# Patient Record
Sex: Female | Born: 1980 | Race: White | Hispanic: No | Marital: Single | Smoking: Current every day smoker
Health system: Western US, Academic
[De-identification: ages and names within clinical notes are randomized; demographics above are authoritative.]

---
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
## 2009-06-28 HISTORY — PX: OTHER PROCEDURE: U1053

## 2011-05-22 IMAGING — RF DG VCUG
7 series · 7 of 7 positions shown · non-contrast
Comparison: Cystogram 05/05/2009

CLINICAL DATA: Status post diverticulectomy.

VOIDING CYSTOURETHROGRAM
TECHNIQUE: After catheterization of the urinary bladder following
sterile technique the bladder was filled with 200 ml Cysto-hypaque
30% by drip infusion.  Serial spot images were obtained during
bladder filling and voiding.
Fluoroscopy Time: 2.1 minutes

[Series 1: run · 1 of 1 slices shown (1 of 7)]
[im 1/1]
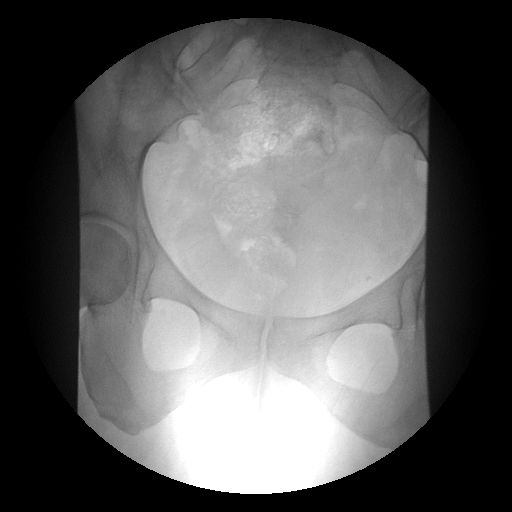

[Series 2: run · 1 of 1 slices shown (2 of 7)]
[im 1/1]
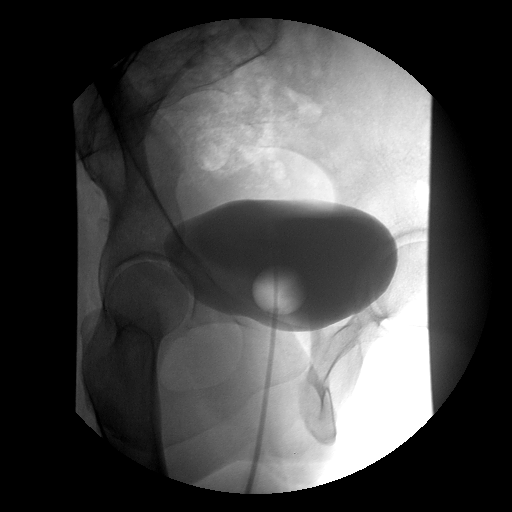

[Series 3: run · 1 of 1 slices shown (3 of 7)]
[im 1/1]
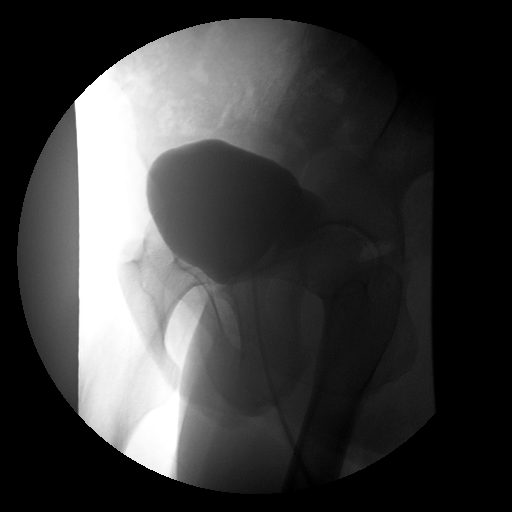

[Series 4: run · 1 of 1 slices shown (4 of 7)]
[im 1/1]
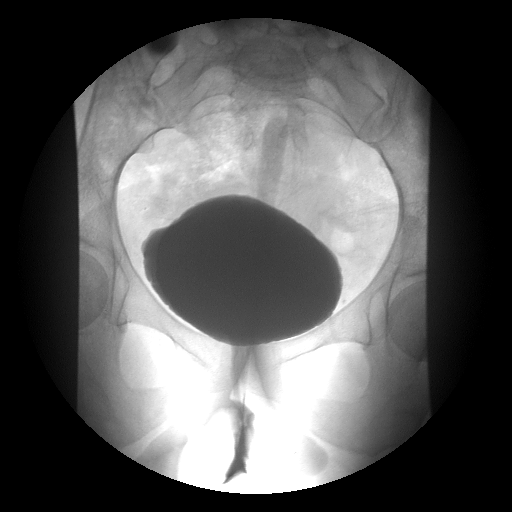

[Series 5: run · 1 of 1 slices shown (5 of 7)]
[im 1/1]
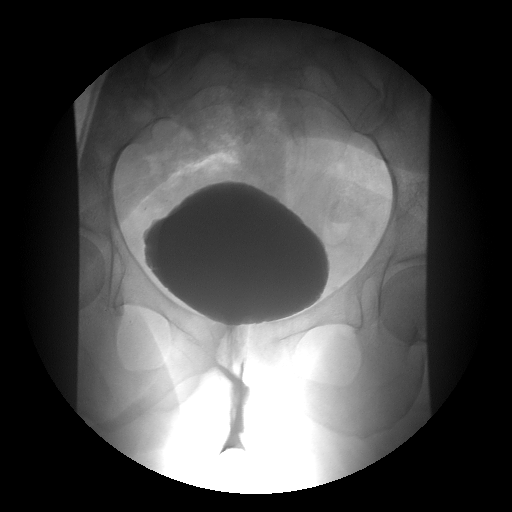

[Series 6: run · 1 of 1 slices shown (6 of 7)]
[im 1/1]
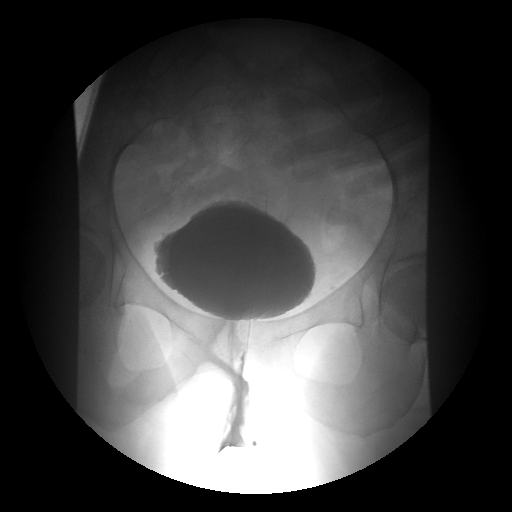

[Series 7: run · 1 of 1 slices shown (7 of 7)]
[im 1/1]
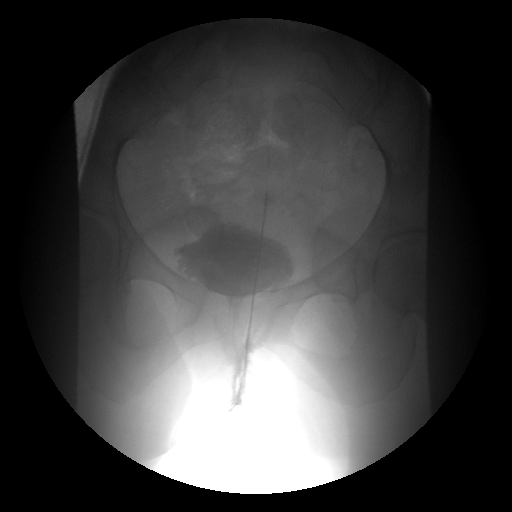

[7 of 7 positions shown; findings below may reference images not displayed]

FINDINGS: The bladder is normal.  The voiding phase appeared
normal.  There is no contrast filling of the urethral diverticulum.
No unusual contrast extravasation.
IMPRESSION: Negative VCUG.

## 2012-03-31 ENCOUNTER — Telehealth (INDEPENDENT_AMBULATORY_CARE_PROVIDER_SITE_OTHER): Payer: Self-pay

## 2012-03-31 NOTE — Telephone Encounter (Signed)
Faxed authorization from [employer] [facility] received to be scanned in to media.    Please close encounter.

## 2012-04-19 ENCOUNTER — Encounter (INDEPENDENT_AMBULATORY_CARE_PROVIDER_SITE_OTHER): Payer: Self-pay | Admitting: Dermatology

## 2012-04-19 ENCOUNTER — Ambulatory Visit (INDEPENDENT_AMBULATORY_CARE_PROVIDER_SITE_OTHER): Payer: Self-pay | Admitting: Dermatology

## 2012-04-19 DIAGNOSIS — D485 Neoplasm of uncertain behavior of skin: Secondary | ICD-10-CM

## 2012-04-19 DIAGNOSIS — D239 Other benign neoplasm of skin, unspecified: Secondary | ICD-10-CM

## 2012-04-19 MED ORDER — LEVONORGESTREL-ETHINYL ESTRAD 0.1-20 MG-MCG OR TABS
1.00 | ORAL_TABLET | Freq: Every day | ORAL | Status: DC
Start: ? — End: 2015-11-06

## 2012-04-19 MED ORDER — MUPIROCIN 2 % EX OINT
TOPICAL_OINTMENT | CUTANEOUS | Status: DC
Start: 2012-04-19 — End: 2015-11-06

## 2012-04-23 LAB — DERM PATH TISSUE EXAM

## 2012-04-25 ENCOUNTER — Telehealth (INDEPENDENT_AMBULATORY_CARE_PROVIDER_SITE_OTHER): Payer: Self-pay | Admitting: Dermatology

## 2012-04-25 NOTE — Telephone Encounter (Signed)
Spoke to patient  Pathology  A) R breast: COMPOUND NEVUS WITH ATYPIA, + lateral margin  SEE NOTE:  Sections demonstrate junctional nests of slightly  atypical melanocytes. In the underlying dermis, there is  horizontal collagen and vertical vessels as well as pigment  incontinence. Within this area of scar formation, there are  scattered nests of mildly atypical nevomelanocytes. Overall,  these findings are of a compound nevus with atypia. Given the  clinical history of a laser treatment at the site, the scar and  entrapped dermal nevus could be consistent with prior treatment  to the area. The nevus extends to the lateral margins.    B) mid back: COMPOUND DYSPLASTIC NEVUS WITH MILD ATYPIA, clear    I explained that both lesions are benign and no further treatment is necessary.  I explained that the pathologist considers the R breast lesion to be a benign compound nevus.  I mentioned that if pigment were noted in the scar on the mid back, that consideration for removal of the pigment should be entertained.    RTC:  6 months

## 2012-11-15 ENCOUNTER — Telehealth (INDEPENDENT_AMBULATORY_CARE_PROVIDER_SITE_OTHER): Payer: Self-pay | Admitting: Student Health

## 2012-11-15 NOTE — Telephone Encounter (Signed)
Received fax from [facility] referring patient to Osteopathic Medicine. Per office manager, we do not accept referrals from [facility]. If patient wants to continue to schedule with Osteopath, the patient must confirm with Insurance that Kimberly [name] is a covered benefit and must sign an ABN form when arrived at appointment. Attempted to contact patient to inform her of the information above, no answer at phone listed on file.     Family Medicine Call Center:     If patient calls, please read message above and provide scheduling options at our family medicine clinics with Dr. Yvetta Coder or Dr. Meredeth Ide.    Closing encounter as no further notes are needed.

## 2012-11-16 DIAGNOSIS — R079 Chest pain, unspecified: Secondary | ICD-10-CM | POA: Insufficient documentation

## 2012-11-17 ENCOUNTER — Emergency Department
Admission: EM | Admit: 2012-11-17 | Discharge: 2012-11-17 | Disposition: A | Discharge status: Home / Self Care | Payer: BC Managed Care – PPO | Source: Other Acute Inpatient Hospital | Attending: Emergency Medicine | Admitting: Emergency Medicine

## 2012-11-17 DIAGNOSIS — R0781 Pleurodynia: Secondary | ICD-10-CM

## 2012-11-17 MED ORDER — KETOROLAC TROMETHAMINE 30 MG/ML IJ SOLN
30.0000 mg | Freq: Once | INTRAMUSCULAR | Status: AC
Start: 2012-11-17 — End: 2012-11-17
  Administered 2012-11-17: 30 mg via INTRAVENOUS
  Filled 2012-11-17: qty 1

## 2012-11-17 MED ORDER — MORPHINE SULFATE 2 MG/ML IJ SOLN
2.0000 mg | Freq: Once | INTRAMUSCULAR | Status: AC
Start: 2012-11-17 — End: 2012-11-17
  Administered 2012-11-17: 2 mg via INTRAVENOUS
  Filled 2012-11-17: qty 1

## 2012-11-17 MED ORDER — KETOROLAC TROMETHAMINE 30 MG/ML IJ SOLN
30.0000 mg | Freq: Once | INTRAMUSCULAR | Status: DC
Start: 2012-11-17 — End: 2012-11-17

## 2012-11-17 MED ORDER — SODIUM CHLORIDE 0.9 % IV SOLN
Freq: Once | INTRAVENOUS | Status: DC
Start: 2012-11-17 — End: 2012-11-17

## 2012-11-17 MED ORDER — KETOROLAC TROMETHAMINE 60 MG/2ML IM SOLN
60.0000 mg | Freq: Once | INTRAMUSCULAR | Status: DC
Start: 2012-11-17 — End: 2012-11-17

## 2012-11-21 NOTE — ED Follow-up Note (Signed)
 Follow-up type: Callback       Routine ED Patient Call Back    Patient unable to be contacted, no message left

## 2013-02-05 ENCOUNTER — Telehealth (INDEPENDENT_AMBULATORY_CARE_PROVIDER_SITE_OTHER): Payer: Self-pay

## 2013-02-05 NOTE — Telephone Encounter (Signed)
 Pt calling back regarding message from 5/21,      Message from 5/21  Received fax from [facility] referring patient to Osteopathic Medicine. Per office manager, we do not accept referrals from [facility]. If patient wants to continue to schedule with Osteopath, the patient must confirm with Insurance that Rhetta Cellar is a covered benefit and must sign an ABN form when arrived at appointment. Attempted to contact patient to inform her of the information above, no answer at phone listed on file.     Family Medicine Call Center:     Per pt will contact insurance, to check if cover benefit, pt will call back,

## 2013-04-05 ENCOUNTER — Encounter (INDEPENDENT_AMBULATORY_CARE_PROVIDER_SITE_OTHER): Payer: Self-pay | Admitting: Family Medicine

## 2013-04-05 ENCOUNTER — Other Ambulatory Visit: Payer: BC Managed Care – PPO | Attending: Family Medicine | Admitting: Family Medicine

## 2013-04-05 VITALS — BP 118/81 | HR 79 | Temp 97.6°F | Resp 15 | Ht 64.0 in | Wt 124.5 lb

## 2013-04-05 DIAGNOSIS — Z23 Encounter for immunization: Secondary | ICD-10-CM | POA: Insufficient documentation

## 2013-04-05 DIAGNOSIS — Z0000 Encounter for general adult medical examination without abnormal findings: Secondary | ICD-10-CM | POA: Insufficient documentation

## 2013-04-05 DIAGNOSIS — Z0189 Encounter for other specified special examinations: Secondary | ICD-10-CM

## 2013-04-05 DIAGNOSIS — R590 Localized enlarged lymph nodes: Secondary | ICD-10-CM

## 2013-04-05 DIAGNOSIS — R599 Enlarged lymph nodes, unspecified: Secondary | ICD-10-CM | POA: Insufficient documentation

## 2013-04-05 DIAGNOSIS — M542 Cervicalgia: Secondary | ICD-10-CM | POA: Insufficient documentation

## 2013-04-05 LAB — CBC WITH DIFF, BLOOD
ANC-Automated: 3.6 10*3/uL (ref 1.6–7.0)
Abs Eosinophils: 0.1 10*3/uL (ref 0.0–0.5)
Abs Lymphs: 2.6 10*3/uL (ref 0.8–3.1)
Abs Monos: 0.7 10*3/uL (ref 0.2–0.8)
Eosinophils: 1 % (ref 1–7)
Hct: 40.3 % (ref 34.0–45.0)
Hgb: 13.7 gm/dL (ref 11.2–15.7)
Lymphocytes: 37 % (ref 19–53)
MCH: 32.1 pg — ABNORMAL HIGH (ref 26.0–32.0)
MCHC: 34 % (ref 32.0–36.0)
MCV: 94.4 um3 (ref 79.0–95.0)
MPV: 9.8 fL (ref 9.4–12.4)
Monocytes: 11 % (ref 5–12)
Plt Count: 317 10*3/uL (ref 140–370)
RBC: 4.27 10*6/uL (ref 3.90–5.20)
RDW: 12 % (ref 12.0–14.0)
Segs: 51 % (ref 34–71)
WBC: 7 10*3/uL (ref 4.0–10.0)

## 2013-04-05 LAB — COMPREHENSIVE METABOLIC PANEL, BLOOD
ALT (SGPT): 14 U/L (ref 0–33)
AST (SGOT): 20 U/L (ref 0–32)
Albumin: 4.7 g/dL (ref 3.5–5.2)
Alkaline Phos: 38 U/L (ref 35–140)
BUN: 11 mg/dL (ref 6–20)
Bicarbonate: 24 mmol/L (ref 22–29)
Bilirubin, Tot: 0.3 mg/dL (ref <1.2)
Calcium: 9.9 mg/dL (ref 8.6–10.0)
Chloride: 102 mmol/L (ref 98–107)
Creatinine: 0.81 mg/dL (ref 0.51–0.95)
GFR: >60 mL/min
Glucose: 91 mg/dL (ref 70–115)
Potassium: 4.7 mmol/L (ref 3.5–5.1)
Sodium: 140 mmol/L (ref 136–145)
Total Protein: 7 g/dL (ref 6.0–8.0)

## 2013-04-05 NOTE — Progress Notes (Signed)
 SUBJECTIVE:  Kimberly [name] is a 32 year old female who presents to clinic for:   Chief Complaint   Patient presents with   . New Patient   . Other     neck issue and throat problem     Pt has 2 yr h/o neck pain, R>L, no trauma or acute inciting event, has been progressively worsening, cramping with sharp exacerbations, radiating to R shoulder at times with paresthesia to R forearm, no weakness, has tried nsaids and massages with temporary relief only  Prior XR imaging nl per pt    Also concerned about palpable mass in R side of neck that intermittently flares up when having neck pain  Has been told about lymph node in that region causing some pain but concerned has other deeper tissue mass causing problems  Denies fever, weight changes, dysphagia or odynophagia  Feels can't get enough air in at times but no cough, DOE or wheezing    Currently on OCP, would like to discuss other forms of contraception  Feels current OCP avian makes her too emotional    Review of Systems  Constitutional: Unusual fatigue  Eyes: Negative  Cardiac: Negative  Pulmonary: Shortness of breath  Gastrointestional: Negative  Neurologic: Negative  Endocrine: Negative  Blood Disease: Negative  Allergy: Negative    History:     Current Outpatient Prescriptions on File Prior to Visit   Medication Sig Dispense Refill   . levonorgestrel -ethinyl estradiol (AVIANE) 0.1-20 MG-MCG per tablet Take 1 tablet by mouth daily.       . mupirocin  (BACTROBAN ) 2 % ointment Apply to affected area 3x daily  22 g  5     No current facility-administered medications on file prior to visit.       No Known Allergies    There are no active problems to display for this patient.    No past medical history on file.  Past Surgical History   Procedure Laterality Date   . Pb removal of tonsils,12+ y/o  1999   . 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 Stroke Paternal Grandmother    . Stroke Paternal  Grandfather      History     Social History   . Marital Status: Single     Spouse Name: N/A     Number of Children: N/A   . Years of Education: N/A     Occupational History   . Not on file.     Social History Main Topics   . Smoking status: Current Every Day Smoker -- 0.50 packs/day   . Smokeless tobacco: Never Used   . Alcohol Use: 2.0 oz/week     4 drink(s) per week   . Drug Use: Not on file   . Sexual Activity: Yes     Partners: Male     Birth Control/ Protection: Abstinence     Other Topics Concern   . Not on file     Social History Narrative   . No narrative on file       OBJECTIVE:    BP 118/81  Pulse 79  Temp(Src) 97.6 F (36.4 C) (Oral)  Resp 15  Ht 5\' 4"  (1.626 m)  Wt 56.473 kg (124 lb 8 oz)  BMI 21.36 kg/m2  SpO2 99%  LMP 03/15/2013    Body mass index is 21.36 kg/(m^2).    General Appearance:  alert, no distress, pleasant affect, cooperative.  Eyes:  conjunctivae and corneas clear. PERRL, EOM's intact.   Ears:  normal TMs and canal.  Nose:  nose shows no deformity and no inflammation, nasal mucosa normal  Mouth: OP clear and mmm.  Neck:  Neck supple. R lower anterior cervical / supraclavicular LAD <1cm, non tender and mobile, thyroid symmetric, normal size.  Heart:  normal rate and regular rhythm, no murmurs, clicks, or gallops.  Lungs: clear to auscultation bilateral, no wheezing, rales or rhonchi  Skin:  Skin color, texture, turgor normal.  Musculoskeletal:   Neck: tenderness over lower cervical spine and nuchal area, tenderness over trapezial muscles on R side, full but painful C-spine range of motion  Shoulders: normal exam, no swelling, tenderness, instability; ligaments intact, FROM shoulder joint  Back: symmetric, no curvature. ROM normal. No CVA tenderness.  FROM and strength 5/5 in all 4 extremities      ASSESMENT AND PLAN:  Kimberly [name] was seen today for new patient, other and collect specimen.    Diagnoses and associated orders for this visit:    Neck pain  Seems mostly muscular in origin  although does have some radicular like symptoms intermittently  Will start with OMT as pt very interested in it  If no improvement in next few weeks or any worsening symptoms will get further imaging   ED and return to clinic precautions discussed with patient including non resolving or worsening symptoms, as well as any other questions or concerns.  - [facility]  - Mychart Access Code Procedure    Lymphadenopathy, supraclavicular  Pt significantly concerned, will get further w/u with CBC and US    - US  Soft Tissue Head/Neck; Future    Encounter for routine laboratory testing  - CBC w/Diff Lavender; Future  - Comprehensive Metabolic Panel Green Plasma Separator Tube; Future    Influenza vaccine needed  - Flu Vaccine, IM >=3 Years      Medication Management   Medications reviewed with patient and medication list reconciled.   Over the counter medications, herbal therapies and supplements reviewed.   Patient's understanding and response to medications assessed.   Barriers to medications assessed and addressed.   Risks, benefits, alternatives to medications reviewed.     Barriers to Learning assessed: none. Patient verbalizes understanding of teaching and instructions.       Alfrieda Infante MD  [facility]  [name]

## 2013-04-05 NOTE — Interdisciplinary (Signed)
 Pre-visit chart review and huddle completed with staff and physician.    Outstanding labs, imaging and consults reviewed and identified.    Health maintanence issues identified and addressed:    Health Maintenance   Topic Date Due   . Imm_td/tdap=>32 Yo  07/14/1991   . Cervical Cancer Screening  07/13/2001   . Influenza Vaccine  02/26/2013

## 2013-04-11 ENCOUNTER — Ambulatory Visit
Admission: RE | Admit: 2013-04-11 | Discharge: 2013-04-11 | Disposition: A | Discharge status: Home / Self Care | Payer: BC Managed Care – PPO | Source: Ambulatory Visit | Attending: Body Imaging | Admitting: Body Imaging

## 2013-04-11 DIAGNOSIS — R599 Enlarged lymph nodes, unspecified: Secondary | ICD-10-CM | POA: Insufficient documentation

## 2013-04-16 ENCOUNTER — Telehealth (INDEPENDENT_AMBULATORY_CARE_PROVIDER_SITE_OTHER): Payer: Self-pay | Admitting: Family Medicine

## 2013-04-16 NOTE — Telephone Encounter (Signed)
 Patient called requesting status on referral below.   Referral status is showing "NEW"  Patient has a schedule appointment for 10/28 @ 3:30pm with Dr. Robbie Chiles.    CONSULT/REFERRAL TO OSTEOPATHIC MANUAL MEDICINE (Order [id number])    Date placed: 04/05/13 09:10 AM  Priority: Routine    Please advise.

## 2013-04-19 NOTE — Telephone Encounter (Signed)
 No authorization required. Referral noted.

## 2013-04-24 ENCOUNTER — Ambulatory Visit (INDEPENDENT_AMBULATORY_CARE_PROVIDER_SITE_OTHER): Payer: BC Managed Care – PPO | Admitting: Neuromusculoskeletal Medicine & OMM

## 2013-04-24 ENCOUNTER — Encounter (INDEPENDENT_AMBULATORY_CARE_PROVIDER_SITE_OTHER): Payer: Self-pay | Admitting: Neuromusculoskeletal Medicine & OMM

## 2013-04-24 VITALS — BP 99/65 | HR 88 | Temp 98.5°F | Resp 16 | Ht 64.0 in | Wt 124.0 lb

## 2013-04-24 NOTE — Patient Instructions (Signed)
No heavy lifting or high impact activity after OMT  Keep well hydrated after OMT
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
## 2013-04-24 NOTE — Interdisciplinary (Signed)
Pre-visit chart review and huddle completed with staff and physician.    Outstanding labs, imaging and consults reviewed and identified.    Health maintanence issues identified and addressed:    Health Maintenance   Topic Date Due   • Imm_td/tdap=>32 Yo  07/14/1991   • Cervical Cancer Screening  07/13/2001   • Influenza Vaccine  02/26/2014

## 2013-05-01 ENCOUNTER — Encounter (INDEPENDENT_AMBULATORY_CARE_PROVIDER_SITE_OTHER): Payer: Self-pay | Admitting: Neuromusculoskeletal Medicine & OMM

## 2013-05-01 ENCOUNTER — Ambulatory Visit (INDEPENDENT_AMBULATORY_CARE_PROVIDER_SITE_OTHER): Payer: BC Managed Care – PPO | Admitting: Neuromusculoskeletal Medicine & OMM

## 2013-05-01 ENCOUNTER — Telehealth (INDEPENDENT_AMBULATORY_CARE_PROVIDER_SITE_OTHER): Payer: Self-pay | Admitting: Family Medicine

## 2013-05-01 VITALS — BP 112/72 | HR 93 | Temp 98.1°F | Resp 16 | Ht 64.0 in | Wt 125.5 lb

## 2013-05-16 NOTE — Telephone Encounter (Signed)
 This MA called patient, NA. LM on voice mail relayed msg from MD. Letter mailed to patient .

## 2013-05-16 NOTE — Telephone Encounter (Signed)
 Please call pt and inform her of benign ultrasound and lab work  Letter in system to please mail to her to keep for her records.

## 2013-06-04 ENCOUNTER — Encounter (INDEPENDENT_AMBULATORY_CARE_PROVIDER_SITE_OTHER): Payer: BC Managed Care – PPO | Admitting: Obstetrics & Gynecology

## 2013-06-05 ENCOUNTER — Ambulatory Visit (INDEPENDENT_AMBULATORY_CARE_PROVIDER_SITE_OTHER): Payer: BC Managed Care – PPO | Admitting: Neuromusculoskeletal Medicine & OMM

## 2013-06-05 VITALS — BP 106/63 | HR 88 | Temp 99.3°F | Resp 16 | Ht 64.0 in | Wt 127.0 lb

## 2013-06-05 NOTE — Interdisciplinary (Signed)
Pre-visit chart review and huddle completed with staff and physician.    Outstanding labs, imaging and consults reviewed and identified.    Health maintanence issues identified and addressed:    Health Maintenance   Topic Date Due   • Imm_td/tdap=>32 Yo  07/14/1991   • Cervical Cancer Screening  07/13/2001   • Influenza Vaccine  02/26/2014
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
## 2013-06-05 NOTE — Patient Instructions (Signed)
No heavy lifting or high impact activity after OMT  Keep well hydrated after OMT

## 2013-06-12 ENCOUNTER — Encounter (INDEPENDENT_AMBULATORY_CARE_PROVIDER_SITE_OTHER): Payer: Self-pay | Admitting: Neuromusculoskeletal Medicine & OMM

## 2013-06-12 ENCOUNTER — Ambulatory Visit (INDEPENDENT_AMBULATORY_CARE_PROVIDER_SITE_OTHER): Payer: BC Managed Care – PPO | Admitting: Neuromusculoskeletal Medicine & OMM

## 2013-06-12 VITALS — BP 110/72 | HR 79 | Temp 99.3°F | Resp 14 | Ht 64.75 in | Wt 124.0 lb

## 2013-06-12 NOTE — Progress Notes (Signed)
 SUBJECTIVE  HPI:  L side feels better where cracked it and R side, worst upper neck base of skull on R. Just getting over a cold  ================================================    Patient Active Problem List    Diagnosis Date Noted   . Cervicalgia 05/01/2013   . Shoulder pain 05/01/2013       Family History   Problem Relation Age of Onset   . Stroke Paternal Grandmother    . Stroke Paternal Grandfather        No family status information on file.       History     Social History   . Marital Status: Single     Spouse Name: N/A     Number of Children: N/A   . Years of Education: N/A     Social History Main Topics   . Smoking status: Current Every Day Smoker -- 0.50 packs/day   . Smokeless tobacco: Never Used   . Alcohol Use: 2.0 oz/week     4 drink(s) per week   . Drug Use: Not on file   . Sexual Activity: Yes     Partners: Male     Birth Control/ Protection: Abstinence     Other Topics Concern   . Not on file     Social History Narrative   . No narrative on file         OBJECTIVE    Filed Vitals:    06/12/13 1308   BP: 110/72   Pulse: 79   Temp: 99.3 F (37.4 C)   Resp: 14   Height: 5' 4.75" (1.645 m)   Weight: 56.246 kg (124 lb)       GENERAL: Pt appears well, alert and oriented x3, pleasant and cooperative. No acute distress  NEURO: Gross sensation is intact. Reflexes are normal.   MUSCULOSKELETAL: Grip strength and leg strength normal bilaterally.    OSTEOPATHIC STRUCTURAL EXAM  Cranial: SBS compression  Cervical: C 2 rR, C 3-4 E rsL  Thoracic: T 3-7 F rsL  Lumbar: L 5 E rsR  Sacrum: bilat extension  Pelvic: R upslip  Ribs: R ribs 1-2 elevated  Upper Extremity: R AC area tender  Lower Extremity: L thigh extrenal rotated          ================================================  ASESSMENT and PLAN  Somatic Dysfunction to all above areas listed above on exam.   Medical decision to use OMT today:   Based on careful review of the history, physical examination and relavent imaging, I believe the patients  complaints may be attributed in part to underlying somatic dysfunction as described above. Physical and structural objective findings indicate changes in somatic complaints and a medical decision was made during visit that Osteopathic Manipulative Treatment (OMT) would be necessary due to such changes.   After discussion with the patient and consideration of all theraputic options, the patient decided to pursue OMT. Discussed Risks, Benefits, and Alternatives to procedure. Questions answered. Patient voiced understanding and wished to proceed. The risks include increase in pain, transient soreness, and fatigue. The patient was verbally consented for the procedure.   OMT was applied to all 9 areas listed above that are interconnected with the complaint of symptoms. The patient tolerated this well with no apparent side effects.   Stretching and exercises were given and demonstrated to the patient.      Angelle was seen today for pain.    Diagnoses and associated orders for this visit:    Cervicalgia  - Osteopathic Manipulation;  Future    Nonallopathic lesion of head region, not elsewhere classified  - Osteopathic Manipulation; Future    Nonallopathic lesion of cervical region, not elsewhere classified  - Osteopathic Manipulation; Future    Nonallopathic lesion of thoracic region, not elsewhere classified  - Osteopathic Manipulation; Future    Nonallopathic lesion of lumbar region, not elsewhere classified  - Osteopathic Manipulation; Future    Nonallopathic lesion of sacral region, not elsewhere classified  - Osteopathic Manipulation; Future    Nonallopathic lesion of pelvic region, not elsewhere classified  - Osteopathic Manipulation; Future    Nonallopathic lesion of lower extremities, not elsewhere classified  - Osteopathic Manipulation; Future    Nonallopathic lesion of upper extremities, not elsewhere classified  - Osteopathic Manipulation; Future    Nonallopathic lesion of rib cage, not elsewhere  classified  - Osteopathic Manipulation; Future

## 2013-06-12 NOTE — Interdisciplinary (Signed)
Pre-visit chart review and huddle completed with staff and physician.    Outstanding labs, imaging and consults reviewed and identified.    Health maintanence issues identified and addressed:    Health Maintenance   Topic Date Due   • Imm_td/tdap=>32 Yo  07/14/1991   • Cervical Cancer Screening  07/13/2001   • Influenza Vaccine  02/26/2014

## 2013-06-12 NOTE — Patient Instructions (Signed)
No heavy lifting or high impact activity after OMT  Keep well hydrated after OMT

## 2013-07-02 ENCOUNTER — Ambulatory Visit (INDEPENDENT_AMBULATORY_CARE_PROVIDER_SITE_OTHER): Payer: BC Managed Care – PPO | Admitting: Obstetrics & Gynecology

## 2013-07-02 VITALS — BP 94/61 | HR 64 | Temp 98.5°F | Ht 64.75 in | Wt 126.0 lb

## 2013-07-02 DIAGNOSIS — Z3009 Encounter for other general counseling and advice on contraception: Secondary | ICD-10-CM

## 2013-07-02 MED ORDER — MISOPROSTOL 200 MCG OR TABS
200.0000 ug | ORAL_TABLET | Freq: Once | ORAL | Status: DC
Start: 2013-07-02 — End: 2015-11-06

## 2013-07-02 MED ORDER — IBUPROFEN 800 MG OR TABS
800.0000 mg | ORAL_TABLET | Freq: Four times a day (QID) | ORAL | Status: DC | PRN
Start: 2013-07-02 — End: 2015-11-06

## 2013-07-02 NOTE — Progress Notes (Signed)
GYNECOLOGY NOTE    DIAGNOSIS:  1. Desires IUD    PLAN:  1. IUD counseling done - pamphlets given  2. Pre procedure prescriptions given  3. RTC for IUD placement      Kimberly [name] is a 33 year old G0 who presents to discuss IUD placement. 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        mupirocin (BACTROBAN) 2 % ointment Apply to affected area 3x daily  22 g  5     No current facility-administered medications on file prior to visit.     NKDA    Family Hx:  Negative DVT/PE    Social Hx:  + tobacco - 1/2 pack/day x 3 year  Occasional ETOH  Negative drugs  In school      Physical Exam:  4    07/02/13  1605   BP: 94/61   Pulse: 64   Temp: 98.5 F (36.9 C)   Body mass index is 21.12 kg/(m^2).  General: A&A.In NAD    Time counseling 20/30 min    Courtney Heys, MD

## 2013-07-03 ENCOUNTER — Encounter (INDEPENDENT_AMBULATORY_CARE_PROVIDER_SITE_OTHER): Payer: Self-pay | Admitting: Neuromusculoskeletal Medicine & OMM

## 2013-07-03 ENCOUNTER — Ambulatory Visit (INDEPENDENT_AMBULATORY_CARE_PROVIDER_SITE_OTHER): Payer: BC Managed Care – PPO | Admitting: Neuromusculoskeletal Medicine & OMM

## 2013-07-03 VITALS — BP 104/61 | HR 77 | Temp 98.0°F | Resp 18 | Ht 64.75 in | Wt 127.0 lb

## 2013-07-03 DIAGNOSIS — M9981 Other biomechanical lesions of cervical region: Secondary | ICD-10-CM

## 2013-07-03 DIAGNOSIS — M542 Cervicalgia: Principal | ICD-10-CM

## 2013-07-03 DIAGNOSIS — M999 Biomechanical lesion, unspecified: Secondary | ICD-10-CM

## 2013-07-03 NOTE — Interdisciplinary (Signed)
Pre-visit chart review and huddle completed with staff and physician.    Outstanding labs, imaging and consults reviewed and identified.    Health maintanence issues identified and addressed:    Health Maintenance   Topic Date Due    Imm_td/tdap=>33 Yo  07/14/1991    Cervical Cancer Screening  07/13/2001    Influenza Vaccine  02/26/2014

## 2013-07-03 NOTE — Patient Instructions (Signed)
No heavy lifting or high impact activity after OMT  Keep well hydrated after OMT

## 2013-07-03 NOTE — Progress Notes (Signed)
SUBJECTIVE  HPI:  Some travel, got sick, muscles were super inflamed. Feel tight R low neck and shoulder then R cranial base.  ================================================    Patient Active Problem List    Diagnosis Date Noted    Cervicalgia 05/01/2013    Shoulder pain 05/01/2013       Family History   Problem Relation Age of Onset    Stroke Paternal Grandmother     Stroke Paternal Grandfather        No family status information on file.       History     Social History    Marital Status: Single     Spouse Name: N/A     Number of Children: N/A    Years of Education: N/A     Social History Main Topics    Smoking status: Current Every Day Smoker -- 0.50 packs/day    Smokeless tobacco: Never Used    Alcohol Use: 2.0 oz/week     4 drink(s) per week    Drug Use: Not on file    Sexual Activity: Yes     Partners: Male     Birth Control/ Protection: Abstinence     Other Topics Concern    Not on file     Social History Narrative    No narrative on file         OBJECTIVE    Filed Vitals:    07/03/13 1340   BP: 104/61   Pulse: 77   Temp: 98 F (36.7 C)   TempSrc: Oral   Resp: 18   Height: 5' 4.75" (1.645 m)   Weight: 57.607 kg (127 lb)   SpO2: 99%       GENERAL: Pt appears well, alert and oriented x3, pleasant and cooperative. No acute distress  NEURO: Gross sensation is intact. Reflexes are normal.  MUSCULOSKELETAL: Grip strength and leg strength normal bilaterally.     OSTEOPATHIC STRUCTURAL EXAM  Cranial: SBS compression  Cervical: C 1 E rsL, C 2 rR, C 5-6 E rsR  Thoracic: T 1-2 E rsR  Lumbar: L 5 E rsL  Sacrum: L/L  Pelvic: R innominate posterior rotated  Ribs: ribs 1-2 elevated  Upper Extremity: R AC area tender  Lower Extremity: L thigh internal rotated          ================================================  ASESSMENT and PLAN  Somatic Dysfunction to all above areas listed above on exam.   Medical decision to use OMT today:   Based on careful review of the history, physical examination and relavent  imaging, I believe the patients complaints may be attributed in part to underlying somatic dysfunction as described above. Physical and structural objective findings indicate changes in somatic complaints and a medical decision was made during visit that Osteopathic Manipulative Treatment (OMT) would be necessary due to such changes.   After discussion with the patient and consideration of all theraputic options, the patient decided to pursue OMT. Discussed Risks, Benefits, and Alternatives to procedure. Questions answered. Patient voiced understanding and wished to proceed. The risks include increase in pain, transient soreness, and fatigue. The patient was verbally consented for the procedure. ZAP, cranial, MFR, BMT, LAS applied  OMT was applied to all 9 areas listed above that are interconnected with the complaint of symptoms. The patient tolerated this well with no apparent side effects.   Stretching and exercises were given and demonstrated to the patient.      Sabreen[place] was seen today for neck pain.    Diagnoses  and associated orders for this visit:    Cervicalgia  - Osteopathic Manipulation; Future    Nonallopathic lesion of head region, not elsewhere classified  - Osteopathic Manipulation; Future    Nonallopathic lesion of cervical region, not elsewhere classified  - Osteopathic Manipulation; Future    Nonallopathic lesion of thoracic region, not elsewhere classified  - Osteopathic Manipulation; Future    Nonallopathic lesion of lumbar region, not elsewhere classified  - Osteopathic Manipulation; Future    Nonallopathic lesion of sacral region, not elsewhere classified  - Osteopathic Manipulation; Future    Nonallopathic lesion of pelvic region, not elsewhere classified  - Osteopathic Manipulation; Future    Nonallopathic lesion of lower extremities, not elsewhere classified  - Osteopathic Manipulation; Future    Nonallopathic lesion of upper extremities, not elsewhere classified  - Osteopathic Manipulation;  Future    Nonallopathic lesion of rib cage, not elsewhere classified  - Osteopathic Manipulation; Future

## 2013-07-23 ENCOUNTER — Other Ambulatory Visit: Payer: BC Managed Care – PPO | Attending: Obstetrics & Gynecology | Admitting: Obstetrics & Gynecology

## 2013-07-23 VITALS — BP 101/68 | HR 59 | Temp 98.4°F | Ht 64.0 in | Wt 123.0 lb

## 2013-07-23 DIAGNOSIS — Z30430 Encounter for insertion of intrauterine contraceptive device: Secondary | ICD-10-CM | POA: Insufficient documentation

## 2013-07-23 MED ORDER — LEVONORGESTREL 20 MCG/24HR IU IUD
1.0000 | INTRAUTERINE_SYSTEM | Freq: Once | INTRAUTERINE | Status: AC
Start: 2013-07-23 — End: 2013-07-23
  Administered 2013-07-23: 1 via INTRAUTERINE

## 2013-07-23 NOTE — Progress Notes (Signed)
IUD Insertion    Patient presents for IUD insertion.  Current BCM: OCP (estrogen/progesterone)  Alternate methods of birth control reviewed.  Patient desires MirenaIUD.  Patient has no contraindications to IUD.  Risks of insertion including pain, bleeding, infection and uterine perforation, possibly requiring exploratory surgery reviewed.  Risk of irregular bleeding, expulsion of IUD, pelvic infection, device failure,  and ectopic pregnancy also reviewed.  Written consent obtained.    LMP: Patient's last menstrual period was 05/21/2013.   Unprotected intercourse since last LNMP? NO  Pregnancy Test:Neg    Exam:  Abd - soft, nontender , no masses  EGBUS - Normal female, no lesions  Vagina - clear  Cervix - no lesions or cervical motion tenderness  Uterus - Anterior normal size, nontender, mobile  Adnexae - without masses or tenderness    Procedure Note:  Speculum inserted in vagina.  Cervix cleansed with Betadine.  Anterior lip of cervix grasped with single tooth tenaculum.  Uterus sounded to 7.5 cm.  Mirena IUD inserted using sterile technique.  Strings trimmed.  Tenaculum removed.  Tenaculum sites hemostatic.  Patient stable after procedure.    Instructions and precautions reviewed with patient.    Courtney Heys, MD  Signature Derived From Controlled Access Password, July 23, 2013, 2:07 PM

## 2013-07-24 ENCOUNTER — Ambulatory Visit (INDEPENDENT_AMBULATORY_CARE_PROVIDER_SITE_OTHER): Payer: BC Managed Care – PPO | Admitting: Neuromusculoskeletal Medicine & OMM

## 2013-07-24 VITALS — BP 103/62 | HR 88 | Temp 97.8°F | Resp 14 | Wt 126.5 lb

## 2013-07-24 DIAGNOSIS — M9981 Other biomechanical lesions of cervical region: Secondary | ICD-10-CM

## 2013-07-24 DIAGNOSIS — R0781 Pleurodynia: Principal | ICD-10-CM

## 2013-07-24 DIAGNOSIS — R079 Chest pain, unspecified: Secondary | ICD-10-CM

## 2013-07-24 DIAGNOSIS — M999 Biomechanical lesion, unspecified: Secondary | ICD-10-CM

## 2013-07-24 LAB — CHLAMYDIA/GC PCR-GENITAL
CHLAMYDIA PCR, GENITAL: NEGATIVE
Gonococcus PCR, Genital: NEGATIVE

## 2013-07-24 NOTE — Interdisciplinary (Signed)
Pre-visit chart review and huddle completed with staff and physician.    Outstanding labs, imaging and consults reviewed and identified.    Health maintanence issues identified and addressed:    Health Maintenance   Topic Date Due   • Imm_td/tdap=>33 Yo  07/14/1991   • Cervical Cancer Screening  07/13/2001   • Influenza Vaccine  02/26/2014
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
## 2013-07-24 NOTE — Patient Instructions (Signed)
No heavy lifting or high impact activity after OMT  Keep well hydrated after OMT

## 2013-08-20 ENCOUNTER — Ambulatory Visit (INDEPENDENT_AMBULATORY_CARE_PROVIDER_SITE_OTHER): Payer: BC Managed Care – PPO | Admitting: Obstetrics & Gynecology

## 2013-08-20 VITALS — BP 102/59 | HR 62 | Temp 97.5°F | Ht 64.0 in | Wt 128.0 lb

## 2013-08-20 DIAGNOSIS — Z975 Presence of (intrauterine) contraceptive device: Secondary | ICD-10-CM

## 2013-08-20 NOTE — Progress Notes (Signed)
GYNECOLOGY NOTE    DIAGNOSIS:  1. IUD in place    PLAN:  1. Litha instructed to call if intermittent pain persist over the next month  2. Otherwise, RTC 1 year    Kimberly [name] returns for follow up of IUD placement. Menses was heavy this month and she complains of intermittent crampy pain - no localized to one side, not requiring pain medication. Pain is improving since insertion    Physical Exam:  4    08/20/13  1318   BP: 102/59   Pulse: 62   Temp: 97.5 F (36.4 C)   Body mass index is 21.96 kg/(m^2).  General: A&A. In NAD  GU: normal external genitalia. IUD strings visualized    Courtney Heys, MD

## 2013-08-28 ENCOUNTER — Telehealth (INDEPENDENT_AMBULATORY_CARE_PROVIDER_SITE_OTHER): Payer: Self-pay | Admitting: Student Health

## 2013-08-28 ENCOUNTER — Telehealth (INDEPENDENT_AMBULATORY_CARE_PROVIDER_SITE_OTHER): Payer: Self-pay | Admitting: Obstetrics & Gynecology

## 2013-08-28 NOTE — Telephone Encounter (Signed)
Pt is calling and states she has been having severe cramping and can barley stand up. Pt states she was instructed to call if pain persisted. Please advise. Thank you.

## 2013-08-28 NOTE — Telephone Encounter (Signed)
I left a message for the patient to return my call.

## 2013-08-28 NOTE — Telephone Encounter (Signed)
Spoke to pt     Calls with continued pain     After  Having IUD      Precautions given     If pain severe    Go to ed      Pt declines    Saying pain not that bad     Desires appt    Scheduled with weber for 3/5

## 2013-08-30 ENCOUNTER — Ambulatory Visit (INDEPENDENT_AMBULATORY_CARE_PROVIDER_SITE_OTHER): Payer: BC Managed Care – PPO | Admitting: Obstetrics & Gynecology

## 2013-08-30 VITALS — BP 109/79 | HR 85 | Temp 97.0°F | Ht 64.0 in | Wt 125.0 lb

## 2013-08-30 DIAGNOSIS — N949 Unspecified condition associated with female genital organs and menstrual cycle: Secondary | ICD-10-CM

## 2013-08-30 DIAGNOSIS — R102 Pelvic and perineal pain: Secondary | ICD-10-CM

## 2013-09-03 NOTE — Progress Notes (Signed)
GYNECOLOGY NOTE    DIAGNOSIS:  1. Pelvic pain  2. IUD in place    PLAN  1. Offered to have IUD removed - Kimberly [name] declines at this time  2. Pelvic sono ordered    Kimberly [name] returns because of intermittent pelvic pain. Pain is described as crampy and after calling the clinic with the pain her menses started.    Physical Exam:  4    08/30/13  0906   BP: 109/79   Pulse: 85   Temp: 97 F (36.1 C)   Body mass index is 21.45 kg/(m^2).  General: A&A. In NAD  GU: normal external genitalia. Normal appearing vagina - no discharge. Normal cervix - IUD strings seen    Courtney Heys, MD

## 2014-03-25 ENCOUNTER — Telehealth (INDEPENDENT_AMBULATORY_CARE_PROVIDER_SITE_OTHER): Payer: Self-pay | Admitting: Obstetrics & Gynecology

## 2014-03-25 ENCOUNTER — Telehealth (INDEPENDENT_AMBULATORY_CARE_PROVIDER_SITE_OTHER): Payer: Self-pay

## 2014-03-25 ENCOUNTER — Ambulatory Visit (INDEPENDENT_AMBULATORY_CARE_PROVIDER_SITE_OTHER): Payer: BC Managed Care – PPO | Admitting: Obstetrics & Gynecology

## 2014-03-25 VITALS — BP 101/64 | HR 86 | Temp 99.8°F | Ht 64.75 in | Wt 120.0 lb

## 2014-03-25 DIAGNOSIS — Z975 Presence of (intrauterine) contraceptive device: Secondary | ICD-10-CM

## 2014-03-25 NOTE — Telephone Encounter (Signed)
IUD Mirena for 6 mo. She reports heavy bleeding for 7 days-it was her menses, she thinks. Has not checked the IUD string. Bleeding  Is resolved today. Pt is advised to check IUD string and an appt will be given if unable to find string. She did not have the U/S ordered in March, 2015, as she was feeling well.   Dr.Weber , please review and advise on f/u. D oyou still recommend U/S?

## 2014-03-25 NOTE — Telephone Encounter (Signed)
[facility] referral received and scanned under media tab.

## 2014-03-25 NOTE — Telephone Encounter (Signed)
Pt calling back, states she checked if she could feel IUD string, but she cannot.  Please advise. Thank you.

## 2014-03-25 NOTE — Telephone Encounter (Signed)
F/U appt today-IUD check-unable to feel string-had heavy vag bleeding lasting 7 days. Denies bleeding today.

## 2014-03-25 NOTE — Progress Notes (Signed)
GYNECOLOGY NOTE    Kimberly [name] is a 33 year old who returns for IUD check. This lmp was very heavy and prolonged and Kimberly [name] could not feel her string when instructed by the nurses. Prior to this Kimberly [name] menses were very light. No abdominal pain. No other gyn concerns. Kimberly [name] declines STI Screening    Physical Exam:  4    03/25/14  1541   BP: 101/64   Pulse: 86   Temp: 99.8 F (37.7 C)   Body mass index is 20.12 kg/(m^2).  General:A &A. In NAD  GU: normal external genitalia. Normal vagina. IUD strings seen coming from cervical os. No active bleeding    Assessment: 33 year old with IUD in place, declines STI screen    Plan:  1. RTC prn    Courtney Heys, MD

## 2014-03-25 NOTE — Telephone Encounter (Signed)
Pt calling, she had IUD inserted about 6 months ago.  Her last period was very heavy and abnormal and pt would like to know if that is normal?  Please advise.  Thank you.

## 2014-05-30 ENCOUNTER — Encounter: Payer: Self-pay | Admitting: Adult Health

## 2014-05-30 DIAGNOSIS — M542 Cervicalgia: Principal | ICD-10-CM

## 2014-05-30 DIAGNOSIS — G8929 Other chronic pain: Principal | ICD-10-CM

## 2014-05-30 DIAGNOSIS — M549 Dorsalgia, unspecified: Principal | ICD-10-CM

## 2014-06-06 ENCOUNTER — Ambulatory Visit (HOSPITAL_BASED_OUTPATIENT_CLINIC_OR_DEPARTMENT_OTHER): Payer: No Typology Code available for payment source

## 2014-06-06 ENCOUNTER — Ambulatory Visit (HOSPITAL_BASED_OUTPATIENT_CLINIC_OR_DEPARTMENT_OTHER): Admit: 2014-06-06 | Payer: No Typology Code available for payment source

## 2014-06-06 DIAGNOSIS — M4802 Spinal stenosis, cervical region: Secondary | ICD-10-CM

## 2014-07-22 ENCOUNTER — Ambulatory Visit (INDEPENDENT_AMBULATORY_CARE_PROVIDER_SITE_OTHER): Payer: No Typology Code available for payment source | Admitting: Dermatology

## 2014-07-22 ENCOUNTER — Encounter (INDEPENDENT_AMBULATORY_CARE_PROVIDER_SITE_OTHER): Payer: Self-pay | Admitting: Dermatology

## 2014-07-22 DIAGNOSIS — D229 Melanocytic nevi, unspecified: Secondary | ICD-10-CM

## 2014-07-22 DIAGNOSIS — D239 Other benign neoplasm of skin, unspecified: Secondary | ICD-10-CM

## 2014-07-22 NOTE — Progress Notes (Signed)
CC: had concerns including Consultation. pigmented lesions    HISTORY:  Kimberly [name] is a 34 year old female.    Location: body    REVIEW OF SYSTEMS:  CONSTITUTIONAL: No fever and no chills  DERMATOLOGIC:  Skin lesions:  no pruritus, no pain   All other systems were reviewed and were negative.    MEDICATIONS:  The medications were reviewed.    Current outpatient prescriptions:     ibuprofen (MOTRIN) 800 MG tablet, Take 1 tablet by mouth every 6 hours as needed for Mild Pain (Pain Score 1-3)., Disp: 2 tablet, Rfl: 0    levonorgestrel-ethinyl estradiol (AVIANE) 0.1-20 MG-MCG per tablet, Take 1 tablet by mouth daily., Disp: , Rfl:     misoprostol (CYTOTEC) 200 MCG tablet, Take 1 tablet by mouth once., Disp: 1 tablet, Rfl: 0    mupirocin (BACTROBAN) 2 % ointment, Apply to affected area 3x daily, Disp: 22 g, Rfl: 5    ALLERGIES:  No Known Allergies    PHYSICAL EXAM:  See body map  Constitutional:  No acute distress  Eyes:  Pupils equal, round, and reactive to light, sclera clear  Ears, Nose, Mouth and Throat: Lips moist  Neck:  No thyromegaly  Lymph:  No lymphadenopathy  Lung:  Breathing comfortably, no tachypnea  Cardiovascular:  No overt jugular venous distention, no cyanosis  Musculoskeletal:  Gait normal  Neurologic:  Alert and oriented  Psychiatry:  Calm affect    Skin exam:     Head to toe skin exam including scalp, face, mucous membranes of the eyes and mouth, neck, RUE, LUE, chest, abdomen, back, RLE, LLE, buttock, groin, lymph nodes. Female assistant present.     All was normal or benign-appearing except for the following:  Benign appearing brown macules on arms, chest, back, legs  Biopsy sites healed  No pigment in scar on mid back    ASSESSMENT AND TREATMENT  PLAN:  See body map  1.Benign-appearing pigmented lesions    2.s/p bx and then excision of 3 pigmented lesions on back-April 2013 in [place]    3.R breast: 8x8 mm diameter--2 mm tan ring around white central area  04-19-12:  R breast  laser hair removal about Nov 2012; part of pigmented lesion (central) disappeared; now a brown ring with clear central area.  A) R breast: COMPOUND NEVUS WITH ATYPIA, + lateral margin (04-19-12)  SEE NOTE: Sections demonstrate junctional nests of slightly  atypical melanocytes. In the underlying dermis, there is  horizontal collagen and vertical vessels as well as pigment  incontinence. Within this area of scar formation, there are  scattered nests of mildly atypical nevomelanocytes. Overall,  these findings are of a compound nevus with atypia. Given the  clinical history of a laser treatment at the site, the scar and  entrapped dermal nevus could be consistent with prior treatment  to the area. The nevus extends to the lateral margins.    04-25-12: I explained that both lesions are benign and no further treatment is necessary. I explained that the pathologist considers the R breast lesion to be a benign compound nevus.  07-22-14:  Brown ring around central clear area-bx site healed    4.B) mid back: [place], clear (04-19-12)  07-22-14: no pigment in scar    - Diagnoses, natural course, treatments and risks were discussed with the patient.  - Follow up 1 year or sooner for any concerns.    Skin check

## 2014-07-22 NOTE — Patient Instructions (Signed)
SUNSCREENS:       For acne prone skin:   - Neutrogena facial moisturizer with SPF 30 or higher. It should be "non-comedogenic" to prevent clogging of your pores.     - ELTA MD UV Clear SPF 46 facial moisturizer with sunscreen. You can purchase this at the front desk.     For sensitive skin: avoid sunscreens with a lot of preservatives and fragrances. It's best if the product has only the active ingredients (titanium dioxide or zinc oxide)   - Solbar SPF 30 gel   - Vanicream sunscreen for sensitive skin SPF 60   "Sun Precautions" has an online website for clothing with built in SPF. Rit sunguard on [employer] may add protection factor to clothes.       Remember to avoid sun during the peak hours of 10am - 2pm. Wear a wide-brimmed hat when outside (remember that baseball caps cannot protect the ears and nose). Wear sunglasses that will cover your entire eye and upper cheeks if possible.       Remember the ABCDEs and contact our office if any of the following are observed in your moles:   A-Asymmetry   B-Borders becoming irregular   C-Color is no longer uniform   D-Diameter greater than 6mm   E-Evolving-if your lesion undergoes changes and it evolves into a different appearing lesion   -Use daily sunscreen with an SPF of at least 30   -Ask your hair stylist to alert you if any suspicious lesions are noticed on scalp while getting your hair cut

## 2015-10-08 ENCOUNTER — Ambulatory Visit (INDEPENDENT_AMBULATORY_CARE_PROVIDER_SITE_OTHER): Payer: No Typology Code available for payment source | Admitting: Dermatology

## 2015-10-30 ENCOUNTER — Telehealth (INDEPENDENT_AMBULATORY_CARE_PROVIDER_SITE_OTHER): Payer: Self-pay | Admitting: Neurology

## 2015-10-30 NOTE — Telephone Encounter (Signed)
Pt calling to inform that we would need to request records from [facility] radiology, they are MRI of brain results.     [phone]    She is scheduled to see Dr Amalia Hailey 05/11/207, please advise.

## 2015-10-30 NOTE — Telephone Encounter (Signed)
Nurse called [facility].  Spoke with Alleen Borne. Per Alleen Borne all they have for patient is a Cat Scan.    Sonia requests a signed release of information or a fax cover sheet to request report.  Fax number [phone]    Fax cover sheet faxed today.

## 2015-11-05 ENCOUNTER — Encounter (INDEPENDENT_AMBULATORY_CARE_PROVIDER_SITE_OTHER): Payer: Self-pay | Admitting: Dermatology

## 2015-11-05 ENCOUNTER — Ambulatory Visit (INDEPENDENT_AMBULATORY_CARE_PROVIDER_SITE_OTHER): Payer: No Typology Code available for payment source | Admitting: Dermatology

## 2015-11-05 DIAGNOSIS — L814 Other melanin hyperpigmentation: Principal | ICD-10-CM

## 2015-11-05 DIAGNOSIS — D239 Other benign neoplasm of skin, unspecified: Secondary | ICD-10-CM

## 2015-11-05 DIAGNOSIS — D229 Melanocytic nevi, unspecified: Secondary | ICD-10-CM

## 2015-11-05 NOTE — Patient Instructions (Signed)
[facility]    MOLES AND MELANOMAS      Moles (nevi) are tan or brown, raised or flat areas of the skin which have an increased number of melanocytes. Melanocytes are the cells in our body which make pigment and account for skin color.    Some moles are present at birth (congenital nevi), while others come up later in life (acquired nevi).  The Anise Harbin can stimulate the body to make more moles.  Sunburns are not the only thing that triggers more moles.  Chronic Sadik Piascik exposure can do it too.     Malignant melanoma is a type of skin cancer that can be deadly if it spreads throughout the body. The incidence of melanoma in the [place] is growing rapidly.  Melanoma usually grows near the surface of the skin for a period of time, and then begins to grow deeper into the skin. Once it grows deeper into the skin, the risk of spread to other organs greatly increases. Therefore, early detection and removal of a malignant melanoma can result in a complete cure; removal after the tumor has spread may not be effective.    Melanoma can often be suspected by its appearance. The ABCDE's of melanoma are:    Asymmetry: Asymmetry means if you draw a line through the mole, the two halves do not match in color, size, shape, or surface texture. Asymmetry can be a result of rapid enlargement of a mole, the development of a raised area on a previously flat lesion, scaling, ulceration, bleeding or scabbing within the mole.    Border: The border of a melanoma often blends into the normal skin and does not sharply delineate the mole from normal skin.    Color: Different colors within a mole, or the development of dark black, blue, or red areas in a preexisting mole.    Diameter: Size greater than 0.6cm (1/4 of an inch, or the size of a pencil eraser). This is only a guideline, and many normal moles may be this large or even a bit larger.    Evolving: Any change -- in size, shape, color, elevation, or another trait, or any new symptom such  as bleeding, itching or crusting    Melanomas should be considered when a mole suddenly appears or changes. Itching, burning, or pain in a pigmented lesion should cause suspicion, but most patients with early melanoma have no skin discomfort whatsoever.  The appearance of a mole remains the most reliable method for identifying a malignant melanoma. Suspicious-looking moles may be removed for microscopic examination.    Melanoma can occur anywhere on the skin, including areas that are difficult for self-examination. Many melanomas are first noticed by other family members.      Occasionally, melanomas appear as rapidly growing, blue-black, dome-shaped bumps within a previous mole or previous area of normal skin    Dysplastic moles are moles that fit the ABCDE rules of melanoma, but are not melanomas when examined under the microscope.  They may indicate an increased risk of melanoma in that person. If there is a family history of melanoma, most experts agree that the person is at an increased risk for developing a melanoma.  Experts still do not agree on what dysplastic moles mean in patients without a personal or family history of melanoma.  Dysplastic moles are usually larger than common moles and may have different colors within them with irregular borders. The appearance can be very similar to a melanoma. Biopsies of dysplastic   moles may show abnormalities which are different from a regular mole.      You should take steps to decrease you risk of melanoma.   First, avoid the Nazarene Bunning and protect your skin when it is exposed to the Denijah Karrer.  People who have lived near the equator, people who have intermittent exposures to large amounts of Nhyla Nappi, and people who have had sunburns in childhood or adolescence have an increased risk for melanoma. Brynlynn Walko sense and Simonne Boulos protection are key to preventing melanoma.    Secondly, moles should be looked at regularly.  Melanomas can be diagnosed early by self-examinations at home.  By looking  for the ABCDE's of moles once a month, you should notice if any changes have occurred and if so, you should make an appointment earlier than your next check-up in order to be examined.  On the other hand, don’t check your moles more than once a month or you might not notice a change.

## 2015-11-05 NOTE — Progress Notes (Signed)
[name]      Encounter Date: 11/05/2015    Kimberly [name]  [id number]  Date of Birth: 01-Jul-1980  PCP: [facility]  Referring MD: [facility]    Chief Complaint:   Chief Complaint   Patient presents with   . Recheck     TBC appt.       History of Present Illness:  35 year old female presenting for here for a skin check. She notes a few new asymptomatic brown macules on her left cheek and on her R breast a mole has gotten raised, but no other symptoms or color changes. She is outdoors frequently but uses sunscreen.    Personal history of skin cancer: No, h/o atypical nevi biopsied  Family history of skin cancer:  No    Past Medical History:  History reviewed. No pertinent past medical history.    Medications:  Current Outpatient Prescriptions   Medication Sig   . ibuprofen (MOTRIN) 800 MG tablet Take 1 tablet by mouth every 6 hours as needed for Mild Pain (Pain Score 1-3).   Marland Kitchen levonorgestrel-ethinyl estradiol (AVIANE) 0.1-20 MG-MCG per tablet Take 1 tablet by mouth daily.   . misoprostol (CYTOTEC) 200 MCG tablet Take 1 tablet by mouth once.   . mupirocin (BACTROBAN) 2 % ointment Apply to affected area 3x daily     No current facility-administered medications for this visit.        Allergies:  No Known Allergies    Family & Social History:  Family History   Problem Relation Age of Onset   . Stroke Paternal Grandmother    . Stroke Paternal Grandfather      Social History   Substance [name] Topics   . Smoking status: Current Every Day Smoker     Packs/day: 0.50   . Smokeless tobacco: Never Used   . Alcohol [name] 2.4 oz/week     4 Standard drinks or equivalent per week       Review of Systems:  Constitutional: No fevers, chills, or unexpected weight changes.  Skin: Positive, as discussed in the HPI above.    Physical Examination:  General: Well-developed and well-nourished  Psych:  Alert, oriented x 3  ENT:   Lips,  teeth, gums and oral mucosa without any notable abnormalities    Skin Exam: The following areas were examined and assessed as follows:  Scalp:  Normal  Head/Face: Brown macules scattered on cheeks  Neck:  Scattered brown macules  Chest/Axillae: Scattered brown macules and papules   Abdomen: Scattered brown macules and papules   Groin/Buttocks:Normal  Back:  Scattered brown macules and papules   Arms/hands: Scattered brown macules and papules   Legs/feet: Scattered brown macules and papules; R upper leg with firm hyperpigmented papule      Assessment and Plan:    This is a 35 year old female patient presenting with the following issues:    Benign-appearing melanocytic nevi, distributed across face, trunk, arms, legs. Total count <75.  - Reviewed ABCDEs of melanoma, SSE  - Reviewed Kimberly [name] protection and sunscreen [name]  - Advised patient to return for examination of any changing/suspicious moles    Lentigines, face  - Reassurance these appear clinically benign   - Kimberly [name] protection    Dermatofibroma, R leg  - Reassurance these appear clinically benign     Patient was counseled to appropriate general skin care and Kimberly [name], as well  as warning signs for skin lesions that should prompt medical evaluation.     Recommended return to clinic: 1 year, TBSE    Kimberly [name], M.D. Ph.D.  [facility] - Dermatology  [facility]

## 2015-11-06 ENCOUNTER — Other Ambulatory Visit (INDEPENDENT_AMBULATORY_CARE_PROVIDER_SITE_OTHER): Payer: No Typology Code available for payment source

## 2015-11-06 ENCOUNTER — Ambulatory Visit (INDEPENDENT_AMBULATORY_CARE_PROVIDER_SITE_OTHER): Payer: No Typology Code available for payment source | Admitting: Neurology

## 2015-11-06 ENCOUNTER — Encounter (INDEPENDENT_AMBULATORY_CARE_PROVIDER_SITE_OTHER): Payer: Self-pay | Admitting: Neurology

## 2015-11-06 ENCOUNTER — Other Ambulatory Visit
Admission: RE | Admit: 2015-11-06 | Discharge: 2015-11-06 | Disposition: A | Discharge status: Home / Self Care | Payer: No Typology Code available for payment source | Attending: Neurology | Admitting: Neurology

## 2015-11-06 VITALS — BP 114/64 | HR 66 | Temp 98.0°F | Resp 16 | Ht 64.0 in | Wt 120.0 lb

## 2015-11-06 DIAGNOSIS — R55 Syncope and collapse: Principal | ICD-10-CM

## 2015-11-06 DIAGNOSIS — Z3009 Encounter for other general counseling and advice on contraception: Secondary | ICD-10-CM

## 2015-11-06 LAB — BASIC METABOLIC PANEL, BLOOD
Anion Gap: 11 mmol/L (ref 7–15)
BUN: 13 mg/dL (ref 6–20)
Bicarbonate: 28 mmol/L (ref 22–29)
Calcium: 9.4 mg/dL (ref 8.5–10.6)
Chloride: 102 mmol/L (ref 98–107)
Creatinine: 0.75 mg/dL (ref 0.51–0.95)
GFR: >60 mL/min
Glucose: 101 mg/dL — ABNORMAL HIGH (ref 70–99)
Potassium: 4.3 mmol/L (ref 3.5–5.1)
Sodium: 141 mmol/L (ref 136–145)

## 2015-11-06 NOTE — Progress Notes (Signed)
---------------------(data below generated by Peter Garter)------------------------    NEUROLOGY NOTE     Demographics:  Date: Nov 06, 2015   Patient Name: Kimberly [name]   Medical Record #: [id number]   DOB: 1980-07-15  Age: 35 year old  Sex: female    Requested by:      [facility] for neurology consultation    Clinic Location:      [facility]  [address]  [place] [address]    Evaluator(s):   Airen Goldrick was evaluated by: Neurology Attending    Teaching Statement:    A Full History (including allergies, medications, past medical & surgical history, social history, family history, 10 system review of systems) and complete physical exam as performed by Peter Garter, MD, are as transcribed in this and/or the accompanying note. Please also see the patient questionnaire for details.    Chief Complaint   Patient presents with   . Recheck     pt states she feels fine ( has had episodes of fainting Oct. 2016)   . Fainting      History of Present Illness:     Kimberly [name] is a 35 year old female who is here for evaluation of the above chief complaint:    Presents alone and reports:    Two episodes of LOC in late fall 2016, while in [place]. "That I passed out, I fainted, its not unusual for me to faint, I was trying to get to a place to sit down and I didn't make it." I fell and I hit my head, and I went to the hospital (in [place]), and they did an MRI and they didn't find anything. Reports that "It came on a little faster than I'm used to. Recognizes a sensation of "being a little woozy, a feeling that "things are fuzzy and snowy," a sense of dizziness, Reports that she passed out, (the first episode she was sitting down, was out for 10-15 seconds, then  In second event, reportedly fell, was unconscious for many seconds, was scraped, bleeding, and "felt confused."  Went to [place] and was sent to an ER. She's pretty vague about all of this. 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 Thinks that maybe 2-3x/year this will start, but "I'm smarter about avoiding this." Hard to establish if she loses consciousness even while sitting down. No other care or evaluations.     Has felt well since these fall episodes.       Allergies:  No Known Allergies    History reviewed. No pertinent past medical history.    Past Surgical History:   Procedure Laterality Date   . PB REMOVAL OF TONSILS,12+ Y/O  1999   . 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 Stroke Paternal Grandmother    . Stroke Paternal Grandfather    Brain tumor of a grand father in his 74s.   The stroke in [place] in 90s.    Social History     Social History   . Marital status: Single     Spouse name: N/A   . Number of children: N/A   . Years of education: N/A     Social History  Main Topics   . Smoking status: Current Every Day Smoker     Packs/day: 0.50   . Smokeless tobacco: Never Used   . Alcohol use 2.4 oz/week     4 Standard drinks or equivalent per week   . Drug use: None   . Sexual activity: Yes     Partners: Male     Birth control/ protection: Abstinence     Social Activities of Daily Living Present   . None     Social History Narrative   Other Social History (in addition to above): No other social history elements noted today. Reviewed as above.   No drugs. In the fourth year of a phd studying visual art and design. Does freelance work on websites. Lives with husband, travels a lot. Drives, has license.      Review of Systems: Please see patient questionnaire for further details.  ROS (full 10 systems) performed, and is noncontributory unless as noted below:    Exam:   On Exam today, I find:   Vital signs: Recorded in chart, and reviewed by me today.  Vitals:    11/06/15 1001   BP:  114/64   Pulse: 66   Resp: 16   Temp: 98 F (36.7 C)   TempSrc: Oral   SpO2: 93%   Weight: 54.4 kg (120 lb)   Height: 5\' 4"  (1.626 m)         MS: Awake, alert, attentive and conversant without signs of aphasia or neglect.  CN: II-XII intact.  Motor: Normal bulk, power and tone in all four extremities.  Sensory: Normal light touch, proprioception, pin sensation, and temperature sensation in all four extremities.  DTRs: 2/4 in all four extremities, downgoing toes bilaterally.  Coordination: Normal rapid alternating movements and targeted movements in all four extremities. Gait stable with narrow base.     Labs/Imaging/ Studies:       None available.    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 Vaguely described, some elements suggestive of syncope and some of possible partial seizure. Multiple episodes have included LOC initiating after already in seated posture, and some events may have arisen in seated posture. If syncopal, this also raises concern for cardiogenic cause rather than vagal. Educated in depth, will obtain MRI brain with contrast, and EEG. Will also refer for cards evaluation. Continue to practice avoidance (staying well hydrated, laying down at onset of symptoms), and for now, given that last events were >6 months ago, ok to keep driving and other activities. RTC asap after MRI/EEG. Call for any breakthrough events, even without LOC.      Note Author: Peter Garter

## 2019-03-28 ENCOUNTER — Encounter (INDEPENDENT_AMBULATORY_CARE_PROVIDER_SITE_OTHER): Payer: Self-pay | Admitting: Family

## 2019-03-28 DIAGNOSIS — Z1159 Encounter for screening for other viral diseases: Secondary | ICD-10-CM

## 2019-03-29 ENCOUNTER — Telehealth: Payer: Self-pay | Admitting: Clinical

## 2019-03-29 ENCOUNTER — Encounter: Payer: Self-pay | Admitting: Clinical

## 2019-04-02 ENCOUNTER — Encounter (INDEPENDENT_AMBULATORY_CARE_PROVIDER_SITE_OTHER): Payer: Self-pay | Admitting: Family Medicine

## 2019-04-02 DIAGNOSIS — Z1159 Encounter for screening for other viral diseases: Secondary | ICD-10-CM

## 2020-10-04 ENCOUNTER — Encounter: Payer: Self-pay | Admitting: Hospital
